# Patient Record
Sex: Male | Born: 1935 | Race: White | Hispanic: No | Marital: Married | Smoking: Former smoker
Health system: Southern US, Community
[De-identification: ages and names within clinical notes are randomized; demographics above are authoritative.]

## PROBLEM LIST (undated history)

## (undated) DIAGNOSIS — N189 Chronic kidney disease, unspecified: Secondary | ICD-10-CM

## (undated) DIAGNOSIS — I10 Essential (primary) hypertension: Secondary | ICD-10-CM

## (undated) HISTORY — PX: APPENDECTOMY: SHX54

## (undated) HISTORY — PX: TONSILLECTOMY AND ADENOIDECTOMY: SUR1326

---

## 2005-02-23 ENCOUNTER — Inpatient Hospital Stay: Payer: Self-pay | Admitting: Internal Medicine

## 2005-02-23 ENCOUNTER — Other Ambulatory Visit: Payer: Self-pay

## 2005-05-11 ENCOUNTER — Ambulatory Visit: Payer: Self-pay | Admitting: Urology

## 2010-12-21 ENCOUNTER — Ambulatory Visit: Payer: Self-pay | Admitting: Nephrology

## 2014-04-26 ENCOUNTER — Emergency Department: Payer: Self-pay | Admitting: Emergency Medicine

## 2014-08-21 DIAGNOSIS — E559 Vitamin D deficiency, unspecified: Secondary | ICD-10-CM | POA: Insufficient documentation

## 2014-08-21 DIAGNOSIS — N320 Bladder-neck obstruction: Secondary | ICD-10-CM | POA: Insufficient documentation

## 2014-08-21 DIAGNOSIS — I10 Essential (primary) hypertension: Secondary | ICD-10-CM | POA: Insufficient documentation

## 2014-08-21 DIAGNOSIS — N289 Disorder of kidney and ureter, unspecified: Secondary | ICD-10-CM | POA: Insufficient documentation

## 2016-05-14 ENCOUNTER — Encounter: Payer: Self-pay | Admitting: Sports Medicine

## 2016-05-14 ENCOUNTER — Ambulatory Visit (INDEPENDENT_AMBULATORY_CARE_PROVIDER_SITE_OTHER): Payer: Medicare Other | Admitting: Sports Medicine

## 2016-05-14 DIAGNOSIS — B351 Tinea unguium: Secondary | ICD-10-CM | POA: Diagnosis not present

## 2016-05-14 DIAGNOSIS — M79674 Pain in right toe(s): Secondary | ICD-10-CM | POA: Diagnosis not present

## 2016-05-14 DIAGNOSIS — M79675 Pain in left toe(s): Secondary | ICD-10-CM | POA: Diagnosis not present

## 2016-05-14 DIAGNOSIS — M2040 Other hammer toe(s) (acquired), unspecified foot: Secondary | ICD-10-CM | POA: Diagnosis not present

## 2016-05-14 DIAGNOSIS — L84 Corns and callosities: Secondary | ICD-10-CM | POA: Diagnosis not present

## 2016-05-14 NOTE — Progress Notes (Signed)
Subjective: Saaketh Goodlet is a 82 y.o. male patient seen today in office with complaint of painful callus and thickened and elongated toenails; unable to trim. Patient denies history of Diabetes, Neuropathy, or Vascular disease. Patient has no other pedal complaints at this time.   Patient Active Problem List   Diagnosis Date Noted  . 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 Hypertension 08/21/2014  . Renal insufficiency 08/21/2014  . Vitamin D deficiency 08/21/2014    No current outpatient prescriptions on file prior to visit.   No current facility-administered medications on file prior to visit.     No Known Allergies  Objective: Physical Exam  General: Well developed, nourished, no acute distress, awake, alert and oriented x 3  Vascular: Dorsalis pedis artery 1/4 bilateral, Posterior tibial artery 1/4 bilateral, skin temperature warm to warm proximal to distal bilateral lower extremities, mild varicosities, pedal hair present bilateral.  Neurological: Gross sensation present via light touch bilateral.   Dermatological: Skin is warm, dry, and supple bilateral, Nails 1-10 are tender, long, thick, and discolored with mild subungal debris, no webspace macerations present bilateral, no open lesions present bilateral, + hyperkeratotic tissue present left 3rd toe distal tuft No signs of infection bilateral.  Musculoskeletal:Hammertoe boney deformities noted bilateral. Muscular strength within normal limits without painon range of motion. No pain with calf compression bilateral.  Assessment and Plan:  Problem List Items Addressed This Visit    None    Visit Diagnoses    Dermatophytosis of nail    -  Primary   Toe pain, bilateral       Corns and callosities       Hammertoe, unspecified laterality         -Examined patient.  -Discussed treatment options for painful mycotic nails and callus. -Mechanically debrided callus x 1 using sterile blade and reduced mycotic nails with sterile  nail nipper and dremel nail file without incident. -Recommend good supportive shoes and gave silicone toe cap for hammertoe -Patient to return in 3 months for follow up evaluation or sooner if symptoms worsen.  Asencion Islam, DPM

## 2018-08-28 ENCOUNTER — Emergency Department: Payer: Medicare Other

## 2018-08-28 ENCOUNTER — Inpatient Hospital Stay
Admission: EM | Admit: 2018-08-28 | Discharge: 2018-08-29 | DRG: 641 | Disposition: A | Discharge status: Home / Self Care | Payer: Medicare Other | Attending: Internal Medicine | Admitting: Internal Medicine

## 2018-08-28 ENCOUNTER — Encounter: Payer: Self-pay | Admitting: Emergency Medicine

## 2018-08-28 DIAGNOSIS — Z8249 Family history of ischemic heart disease and other diseases of the circulatory system: Secondary | ICD-10-CM

## 2018-08-28 DIAGNOSIS — N183 Chronic kidney disease, stage 3 (moderate): Secondary | ICD-10-CM | POA: Diagnosis present

## 2018-08-28 DIAGNOSIS — I129 Hypertensive chronic kidney disease with stage 1 through stage 4 chronic kidney disease, or unspecified chronic kidney disease: Secondary | ICD-10-CM | POA: Diagnosis present

## 2018-08-28 DIAGNOSIS — R509 Fever, unspecified: Secondary | ICD-10-CM

## 2018-08-28 DIAGNOSIS — Z79899 Other long term (current) drug therapy: Secondary | ICD-10-CM | POA: Diagnosis not present

## 2018-08-28 DIAGNOSIS — E559 Vitamin D deficiency, unspecified: Secondary | ICD-10-CM | POA: Diagnosis present

## 2018-08-28 DIAGNOSIS — E871 Hypo-osmolality and hyponatremia: Principal | ICD-10-CM

## 2018-08-28 DIAGNOSIS — J8410 Pulmonary fibrosis, unspecified: Secondary | ICD-10-CM | POA: Diagnosis present

## 2018-08-28 DIAGNOSIS — Z7982 Long term (current) use of aspirin: Secondary | ICD-10-CM

## 2018-08-28 DIAGNOSIS — Z23 Encounter for immunization: Secondary | ICD-10-CM | POA: Diagnosis not present

## 2018-08-28 DIAGNOSIS — Z801 Family history of malignant neoplasm of trachea, bronchus and lung: Secondary | ICD-10-CM | POA: Diagnosis not present

## 2018-08-28 DIAGNOSIS — A419 Sepsis, unspecified organism: Secondary | ICD-10-CM

## 2018-08-28 HISTORY — DX: Essential (primary) hypertension: I10

## 2018-08-28 HISTORY — DX: Chronic kidney disease, unspecified: N18.9

## 2018-08-28 LAB — COMPREHENSIVE METABOLIC PANEL
ALBUMIN: 3.9 g/dL (ref 3.5–5.0)
ALT: 14 U/L (ref 0–44)
AST: 34 U/L (ref 15–41)
Alkaline Phosphatase: 44 U/L (ref 38–126)
Anion gap: 12 (ref 5–15)
BILIRUBIN TOTAL: 1 mg/dL (ref 0.3–1.2)
BUN: 21 mg/dL (ref 8–23)
CO2: 21 mmol/L — ABNORMAL LOW (ref 22–32)
Calcium: 8.3 mg/dL — ABNORMAL LOW (ref 8.9–10.3)
Chloride: 93 mmol/L — ABNORMAL LOW (ref 98–111)
Creatinine, Ser: 1.77 mg/dL — ABNORMAL HIGH (ref 0.61–1.24)
GFR calc Af Amer: 38 mL/min — ABNORMAL LOW (ref >60)
GFR, EST NON AFRICAN AMERICAN: 33 mL/min — AB (ref >60)
Glucose, Bld: 101 mg/dL — ABNORMAL HIGH (ref 70–99)
POTASSIUM: 3.8 mmol/L (ref 3.5–5.1)
Sodium: 126 mmol/L — ABNORMAL LOW (ref 135–145)
TOTAL PROTEIN: 7.5 g/dL (ref 6.5–8.1)

## 2018-08-28 LAB — CBC WITH DIFFERENTIAL/PLATELET
ABS IMMATURE GRANULOCYTES: 0.03 10*3/uL (ref 0.00–0.07)
BASOS ABS: 0.1 10*3/uL (ref 0.0–0.1)
BASOS PCT: 1 %
Eosinophils Absolute: 0 10*3/uL (ref 0.0–0.5)
Eosinophils Relative: 0 %
HCT: 43 % (ref 39.0–52.0)
HEMOGLOBIN: 14.6 g/dL (ref 13.0–17.0)
IMMATURE GRANULOCYTES: 0 %
Lymphocytes Relative: 10 %
Lymphs Abs: 0.8 10*3/uL (ref 0.7–4.0)
MCH: 32.3 pg (ref 26.0–34.0)
MCHC: 34 g/dL (ref 30.0–36.0)
MCV: 95.1 fL (ref 80.0–100.0)
Monocytes Absolute: 0.7 10*3/uL (ref 0.1–1.0)
Monocytes Relative: 9 %
NEUTROS ABS: 5.8 10*3/uL (ref 1.7–7.7)
NEUTROS PCT: 80 %
PLATELETS: 172 10*3/uL (ref 150–400)
RBC: 4.52 MIL/uL (ref 4.22–5.81)
RDW: 13.6 % (ref 11.5–15.5)
WBC: 7.4 10*3/uL (ref 4.0–10.5)
nRBC: 0 % (ref 0.0–0.2)

## 2018-08-28 LAB — INFLUENZA PANEL BY PCR (TYPE A & B)
INFLAPCR: NEGATIVE
Influenza B By PCR: NEGATIVE

## 2018-08-28 LAB — URINALYSIS, COMPLETE (UACMP) WITH MICROSCOPIC
BACTERIA UA: NONE SEEN
BILIRUBIN URINE: NEGATIVE
GLUCOSE, UA: 50 mg/dL — AB
Ketones, ur: 5 mg/dL — AB
Leukocytes, UA: NEGATIVE
NITRITE: NEGATIVE
PH: 7 (ref 5.0–8.0)
Protein, ur: 30 mg/dL — AB
SPECIFIC GRAVITY, URINE: 1.011 (ref 1.005–1.030)
Squamous Epithelial / LPF: NONE SEEN (ref 0–5)

## 2018-08-28 LAB — PROTIME-INR
INR: 1.13
PROTHROMBIN TIME: 14.4 s (ref 11.4–15.2)

## 2018-08-28 LAB — LACTIC ACID, PLASMA: Lactic Acid, Venous: 1.6 mmol/L (ref 0.5–1.9)

## 2018-08-28 MED ORDER — VANCOMYCIN HCL IN DEXTROSE 1-5 GM/200ML-% IV SOLN
1000.0000 mg | Freq: Once | INTRAVENOUS | Status: AC
  Administered 2018-08-28: 1000 mg via INTRAVENOUS
  Filled 2018-08-28: qty 200

## 2018-08-28 MED ORDER — IPRATROPIUM-ALBUTEROL 0.5-2.5 (3) MG/3ML IN SOLN
3.0000 mL | RESPIRATORY_TRACT | Status: DC
  Administered 2018-08-29 (×2): 3 mL via RESPIRATORY_TRACT
  Filled 2018-08-28 (×2): qty 3

## 2018-08-28 MED ORDER — SODIUM CHLORIDE 0.9 % IV SOLN
2.0000 g | Freq: Once | INTRAVENOUS | Status: AC
  Administered 2018-08-28: 2 g via INTRAVENOUS
  Filled 2018-08-28: qty 2

## 2018-08-28 MED ORDER — ACETAMINOPHEN 500 MG PO TABS
1000.0000 mg | ORAL_TABLET | Freq: Once | ORAL | Status: AC
  Administered 2018-08-28: 1000 mg via ORAL

## 2018-08-28 MED ORDER — VANCOMYCIN HCL IN DEXTROSE 1-5 GM/200ML-% IV SOLN
1000.0000 mg | INTRAVENOUS | Status: DC
  Administered 2018-08-29: 1000 mg via INTRAVENOUS
  Filled 2018-08-28: qty 200

## 2018-08-28 MED ORDER — METRONIDAZOLE IN NACL 5-0.79 MG/ML-% IV SOLN
500.0000 mg | Freq: Three times a day (TID) | INTRAVENOUS | Status: DC
  Administered 2018-08-28 – 2018-08-29 (×3): 500 mg via INTRAVENOUS
  Filled 2018-08-28 (×5): qty 100

## 2018-08-28 MED ORDER — ACETAMINOPHEN 500 MG PO TABS
ORAL_TABLET | ORAL | Status: AC
  Filled 2018-08-28: qty 1

## 2018-08-28 MED ORDER — SODIUM CHLORIDE 0.9 % IV SOLN
2.0000 g | INTRAVENOUS | Status: DC
  Filled 2018-08-28: qty 2

## 2018-08-28 MED ORDER — SODIUM CHLORIDE 0.9 % IV SOLN
Freq: Once | INTRAVENOUS | Status: AC
  Administered 2018-08-28: 17:00:00 via INTRAVENOUS

## 2018-08-28 NOTE — Progress Notes (Signed)
Family Meeting Note  Advance Directive:no  Today a meeting took place with the Patient and daughter.   The following clinical team members were present during this meeting:MD  The following were discussed:Patient's diagnosis: Sepsis, hyponatremia, generalized weakness, chronic kidney disease stage III, patient's progosis: Unable to determine and Goals for treatment: Full Code  Discussed with patient and his daughter in the room, they does not have living will papers done yet.  I encouraged them to get it done while he is in the hospital.  Also encouraged to have a discussion about end-of-life care regarding CPR, defibrillator use, intubation and ventilatory use.  Currently patient and his daughter had never thought about it so they would like to just keep full code.  I had encouraged them to think about it and make decisions.  Additional follow-up to be provided: PMD  Time spent during discussion:20 minutes  Altamese Dilling, MD

## 2018-08-28 NOTE — ED Notes (Signed)
To CT Scan

## 2018-08-28 NOTE — ED Triage Notes (Signed)
Patient presents to the ED for chest pain and shortness of breath after taking a generic form of zyrtec.  Patient states, "I felt like I was getting hay fever."  Patient had a sore throat on Friday.

## 2018-08-28 NOTE — Progress Notes (Addendum)
Pharmacy Antibiotic Note  Lucas Wiggins is a 84 y.o. male admitted on 08/28/2018 with sepsis.  Pharmacy has been consulted for cefepime, and vancomycin dosing. Patient is tachycardic and has a RR > 22.   Ke: 0.032 hr-1, t1/2 21.66, Vd 54   Plan: Patient received a 1000 mg x 1 in the ED. Will start a maintenance dose of 1000 mg q24H with 8 hour stacked dosing. Predictive trough of ~15. Plan to order vancomycin trough prior to 4th dose.   Will start cefepime 2 g q24H.   Height: 6\' 1"  (185.4 cm) Weight: 170 lb (77.1 kg) IBW/kg (Calculated) : 79.9  Temp (24hrs), Avg:99.9 F (37.7 C), Min:99.9 F (37.7 C), Max:99.9 F (37.7 C)  Recent Labs  Lab 08/28/18 1346  WBC 7.4  CREATININE 1.77*  LATICACIDVEN 1.6    Estimated Creatinine Clearance: 32.7 mL/min (A) (by C-G formula based on SCr of 1.77 mg/dL (H)).    No Known Allergies  Antimicrobials this admission: 11/11 vancomyicn >>  11/11 cefepime >>    Dose adjustments this admission: Cefepime 2 g q24H due to CrCl 30-50.    Microbiology results: 11/11 BCx 2/2: pending  Thank you for allowing pharmacy to be a part of this patient's care.  Ronnald Ramp, PharmD  Clinical Pharmacist 08/28/2018 8:06 PM

## 2018-08-28 NOTE — Progress Notes (Signed)
CODE SEPSIS - PHARMACY COMMUNICATION  **Broad Spectrum Antibiotics should be administered within 1 hour of Sepsis diagnosis**  Time Code Sepsis Called/Page Received: 11/11 17:23  Antibiotics Ordered: Cefepime 2g x 1, metronidazole 500 mg q8H, and vancomycin 1000 mg x 1  Time of 1st antibiotic administration: 11/11 1803  Additional action taken by pharmacy: Called RN to notify there was 20 min left to administer abx to meet the 1 hour time limit.   If necessary, Name of Provider/Nurse Contacted: Elmarie Mainland ,PharmD Clinical Pharmacist  08/28/2018  6:20 PM

## 2018-08-28 NOTE — ED Notes (Signed)
Have called dietary at [phone] and [phone] multiple times.  No answer.  Dietary tray for pt not delivered.  No trays in refrigerator.  Asked charge nurse who to call; she said she doesn't know.

## 2018-08-28 NOTE — ED Notes (Signed)
CODEWORD for telephone communication with daughter/family:  [facility]

## 2018-08-28 NOTE — ED Notes (Signed)
Family brought pt food to eat from outside hospital.

## 2018-08-28 NOTE — H&P (Signed)
[facility] [place] [facility]   PATIENT NAME: Lucas Wiggins    MR#:  [id number]  DATE OF BIRTH:  27-Dec-1929  DATE OF ADMISSION:  08/28/2018  PRIMARY CARE PHYSICIAN: Gracelyn Nurse, MD   REQUESTING/REFERRING PHYSICIAN: Paduchowski  CHIEF COMPLAINT:   Chief Complaint  Patient presents with  . Shortness of Breath  . Chest Pain    HISTORY OF PRESENT ILLNESS: Lucas Wiggins  is a 84 y.o. male with a known history of chronic kidney disease, hypertension-very active at home, does not use any support to walk, still drives and lives alone.  Goes for fishing trips with his daughter. Last 2 to 3 days had some generalized weakness which is progressively getting worse.  He also have cough.  Started having low-grade fever and shortness of breath and this prompted to come to emergency room today. Noted to be septic, influenza, UA, chest x-ray work-ups are negative.  Started on IV fluid and broad-spectrum antibiotics and given to hospitalist team for further management. He was also noted to have low sodium level.  PAST MEDICAL HISTORY:   Past Medical History:  Diagnosis Date  . Chronic kidney disease   . Hypertension     PAST SURGICAL HISTORY: History reviewed. No pertinent surgical history.  SOCIAL HISTORY:  Social History   Tobacco Use  . Smoking status: Former Games developer  . Smokeless tobacco: Never Used  Substance Use Topics  . Alcohol use: Not on file    FAMILY HISTORY:  Family History  Problem Relation Age of Onset  . Lung cancer Brother   . CAD Father     DRUG ALLERGIES: No Known Allergies  REVIEW OF SYSTEMS:   CONSTITUTIONAL: He had fever, fatigue or weakness.  EYES: No blurred or double vision.  EARS, NOSE, AND THROAT: No tinnitus or ear pain.  RESPIRATORY: Have cough, shortness of breath, no wheezing or hemoptysis.  CARDIOVASCULAR: No chest pain, orthopnea, edema.  GASTROINTESTINAL: No nausea, vomiting, diarrhea or abdominal pain.  GENITOURINARY:  No dysuria, hematuria.  ENDOCRINE: No polyuria, nocturia,  HEMATOLOGY: No anemia, easy bruising or bleeding SKIN: No rash or lesion. MUSCULOSKELETAL: No joint pain or arthritis.   NEUROLOGIC: No tingling, numbness, weakness.  PSYCHIATRY: No anxiety or depression.   MEDICATIONS AT HOME:  Prior to Admission medications   Medication Sig Start Date End Date Taking? Authorizing Provider  aspirin 325 MG tablet Take 325 mg by mouth daily.   Yes [provider]  Iodine, Kelp, (KELP PO) Take 1 tablet by mouth daily.   Yes [provider]  multivitamin (ONE-A-DAY MEN'S) TABS tablet Take 1 tablet by mouth daily.   Yes [provider]      PHYSICAL EXAMINATION:   VITAL SIGNS: Blood pressure (!) 122/98, pulse (!) 125, temperature 99.9 F (37.7 C), temperature source Oral, resp. rate (!) 22, height 6\' 1"  (1.854 m), weight 77.1 kg, SpO2 93 %.  GENERAL:  84 y.o.-year-old patient lying in the bed with no acute distress.  EYES: Pupils equal, round, reactive to light and accommodation. No scleral icterus. Extraocular muscles intact.  HEENT: Head atraumatic, normocephalic. Oropharynx and nasopharynx clear.  NECK:  Supple, no jugular venous distention. No thyroid enlargement, no tenderness.  LUNGS: Normal breath sounds bilaterally, no wheezing, some crepitation. No use of accessory muscles of respiration.  CARDIOVASCULAR: S1, S2 normal. No murmurs, rubs, or gallops.  ABDOMEN: Soft, nontender, nondistended. Bowel sounds present. No organomegaly or mass.  EXTREMITIES: No pedal edema, cyanosis, or clubbing.  NEUROLOGIC:  Cranial nerves II through XII are intact. Muscle strength 4/5 in all extremities. Sensation intact. Gait not checked.  PSYCHIATRIC: The patient is alert and oriented x 3.  SKIN: No obvious rash, lesion, or ulcer.   LABORATORY PANEL:   CBC Recent Labs  Lab 08/28/18 1346  WBC 7.4  HGB 14.6  HCT 43.0  PLT 172  MCV 95.1  MCH 32.3  MCHC 34.0  RDW 13.6   LYMPHSABS 0.8  MONOABS 0.7  EOSABS 0.0  BASOSABS 0.1   ------------------------------------------------------------------------------------------------------------------  Chemistries  Recent Labs  Lab 08/28/18 1346  NA 126*  K 3.8  CL 93*  CO2 21*  GLUCOSE 101*  BUN 21  CREATININE 1.77*  CALCIUM 8.3*  AST 34  ALT 14  ALKPHOS 44  BILITOT 1.0   ------------------------------------------------------------------------------------------------------------------ estimated creatinine clearance is 32.7 mL/min (A) (by C-G formula based on SCr of 1.77 mg/dL (H)). ------------------------------------------------------------------------------------------------------------------ No results for input(s): TSH, T4TOTAL, T3FREE, THYROIDAB in the last 72 hours.  Invalid input(s): FREET3   Coagulation profile Recent Labs  Lab 08/28/18 1346  INR 1.13   ------------------------------------------------------------------------------------------------------------------- No results for input(s): DDIMER in the last 72 hours. -------------------------------------------------------------------------------------------------------------------  Cardiac Enzymes No results for input(s): CKMB, TROPONINI, MYOGLOBIN in the last 168 hours.  Invalid input(s): CK ------------------------------------------------------------------------------------------------------------------ Invalid input(s): POCBNP  ---------------------------------------------------------------------------------------------------------------  Urinalysis    Component Value Date/Time   COLORURINE YELLOW (A) 08/28/2018 1626   APPEARANCEUR CLEAR (A) 08/28/2018 1626   LABSPEC 1.011 08/28/2018 1626   PHURINE 7.0 08/28/2018 1626   GLUCOSEU 50 (A) 08/28/2018 1626   HGBUR SMALL (A) 08/28/2018 1626   BILIRUBINUR NEGATIVE 08/28/2018 1626   KETONESUR 5 (A) 08/28/2018 1626   PROTEINUR 30 (A) 08/28/2018 1626   NITRITE NEGATIVE  08/28/2018 1626   LEUKOCYTESUR NEGATIVE 08/28/2018 1626     RADIOLOGY: Dg Chest 2 View  Result Date: 08/28/2018 CLINICAL DATA:  Chest pain and shortness of breath EXAM: CHEST - 2 VIEW COMPARISON:  Feb 23, 2005 FINDINGS: There is widespread interstitial fibrosis throughout the lungs bilaterally, slightly more on the left than on the right. There is atelectatic change in the left lower lobe. There is no frank consolidation. Heart size and pulmonary vascularity normal. No adenopathy. There is wedging of midthoracic vertebral bodies with increase in kyphosis in the midthoracic region. IMPRESSION: Widespread interstitial fibrosis bilaterally without consolidation. No adenopathy. Heart size normal. Electronically Signed   By: Bretta Bang III M.D.   On: 08/28/2018 14:20   Ct Chest Wo Contrast  Result Date: 08/28/2018 CLINICAL DATA:  84 year old male with chest pain and shortness of breath after taking generic form of Zyrtec. Subsequent encounter. EXAM: CT CHEST WITHOUT CONTRAST TECHNIQUE: Multidetector CT imaging of the chest was performed following the standard protocol without IV contrast. COMPARISON:  08/28/2018 and 02/23/2005 chest x-ray. 12/21/2010 CT abdomen and pelvis. FINDINGS: Cardiovascular: Heart size within normal limits. Trace pericardial fluid. Marked coronary artery calcifications. Atherosclerotic changes thoracic aorta. Ascending thoracic aorta measures 3.7 cm. Mediastinum/Nodes: Increase number of normal slightly enlarged lymph nodes. Largest lymph node subcarinal region with short axis dimension of 1.9 cm. Lungs/Pleura: Diffuse subpleural cystic changes greater in the bases. Associated reticulation. Findings suggestive of UIP. 7 mm nodule posteromedial right lower lobe (series 4, image 63 and series 10, image 75). Trachea and mainstem bronchi are patent. Upper Abdomen: Renal cysts. Musculoskeletal: Remote T7 and T8 anterior wedge compression fracture with 70% loss of height anterior  aspect T7 and 80% loss of height anterior aspect T8. Vacuum discs T7-8 and T8-9.  Mild kyphosis centered at this level. Schmorl's node deformities superior endplate T7, T8, T9 and inferior endplate T7 and T10. IMPRESSION: 1. Diffuse subpleural cystic changes greater in the bases. Associated reticulation. Findings suggestive of UIP. 2. 7 mm nodule posteromedial right lower lobe (series 4, image 63 and series 10, image 75). Non-contrast chest CT at 6-12 months is recommended. If the nodule is stable at time of repeat CT, then future CT at 18-24 months (from today's scan) is considered optional for low-risk patients, but is recommended for high-risk patients. This recommendation follows the consensus statement: Guidelines for Management of Incidental Pulmonary Nodules Detected on CT Images: From the Fleischner Society 2017; Radiology 2017; 284:228-243. 3. Increase number of normal to slightly enlarged lymph nodes. Largest lymph node subcarinal region with short axis dimension of 1.9 cm. 4. Prominent coronary artery calcifications. 5.  Aortic Atherosclerosis (ICD10-I70.0). 6. Remote T7 and T8 anterior wedge compression fracture with 70% loss of height anterior aspect T7 and 80% loss of height anterior aspect T8. Mild kyphosis centered at this level. Electronically Signed   By: Lacy Duverney M.D.   On: 08/28/2018 18:05    EKG: Orders placed or performed during the hospital encounter of 08/28/18  . EKG 12-Lead  . EKG 12-Lead  . ED EKG 12-Lead  . ED EKG 12-Lead    IMPRESSION AND PLAN:  *Sepsis Unknown origin for now. IV vancomycin and cefepime. IV fluids. Follow culture results. So far influenza, urinalysis, chest x-rays are negative.  *Hyponatremia Could be due to sepsis and decreased intake. IV fluids and recheck  *Chronic kidney disease stage III Appears at baseline, continue to monitor.   All the records are reviewed and case discussed with ED provider. Management plans discussed with the  patient, family and they are in agreement.  CODE STATUS: Full code.  Daughter was present in the room during my visit.  TOTAL TIME TAKING CARE OF THIS PATIENT: 45 minutes.    Altamese Dilling M.D on 08/28/2018   Between 7am to 6pm - Pager - [phone]  After 6pm go to [email or web address] - password [email or web address]  [facility]  Office  [phone]  CC: Primary care physician; Gracelyn Nurse, MD   Note: This dictation was prepared with Dragon dictation along with smaller phrase technology. Any transcriptional errors that result from this process are unintentional.

## 2018-08-28 NOTE — ED Provider Notes (Signed)
[facility] Emergency Department Provider Note  Time seen: 5:21 PM  I have reviewed the triage vital signs and the nursing notes.   HISTORY  Chief Complaint Shortness of Breath and Chest Pain    HPI Junius Faucett is a 84 y.o. male with a past medical history of hypertension, presents to the emergency department for weakness, shortness of breath, sore throat.  According to the patient since Friday he has been feeling a slight sore throat, some generalized fatigue weakness low-grade fever and shortness of breath.  States they measured a temperature of 99.5 at home.  States a slight cough and dry throat.  Mild shortness of breath worse with exertion.  General fatigue and weakness over the past 2 to 3 days.  No vomiting or diarrhea.  No dysuria.  No leg pain or swelling.  Largely negative review of systems otherwise.   History reviewed. No pertinent past medical history.  Patient Active Problem List   Diagnosis Date Noted  . 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 Hypertension 08/21/2014  . Renal insufficiency 08/21/2014  . Vitamin D deficiency 08/21/2014    History reviewed. No pertinent surgical history.  Prior to Admission medications   Medication Sig Start Date End Date Taking? Authorizing Provider  aspirin EC 81 MG tablet Take by mouth.    [provider]  Cholecalciferol 4000 units CAPS Take by mouth.    [provider]  Iodine, Kelp, 0.15 MG TABS Take by mouth.    [provider]  Saw Palmetto Extract (GNP SAW PALMETTO EXTRACT) 80-15 MG CAPS Take by mouth.    [provider]    No Known Allergies  No family history on file.  Social History Social History   Tobacco Use  . Smoking status: Unknown If Ever Smoked  . Smokeless tobacco: Never Used  Substance Use Topics  . Alcohol use: Not on file  . Drug use: Not on file    Review of Systems Constitutional: Low-grade fever at home.  Positive for generalized  weakness. ENT: Mild dry throat. Cardiovascular: Negative for chest pain. Respiratory: Mild shortness of breath, occasional cough. Gastrointestinal: Negative for abdominal pain, vomiting and diarrhea. Genitourinary: Negative for urinary compaints Musculoskeletal: Negative for musculoskeletal complaints Skin: Negative for skin complaints  Neurological: Negative for headache All other ROS negative  ____________________________________________   PHYSICAL EXAM:  VITAL SIGNS: ED Triage Vitals  Enc Vitals Group     BP 08/28/18 1313 (!) 134/98     Pulse Rate 08/28/18 1313 100     Resp 08/28/18 1313 (!) 24     Temp 08/28/18 1313 99.9 F (37.7 C)     Temp Source 08/28/18 1313 Oral     SpO2 08/28/18 1313 95 %     Weight 08/28/18 1322 170 lb (77.1 kg)     Height 08/28/18 1322 6\' 1"  (1.854 m)     Head Circumference --      Peak Flow --      Pain Score 08/28/18 1317 2     Pain Loc --      Pain Edu? --      Excl. in GC? --    Constitutional: Alert and oriented.  Hard of hearing, but no acute distress. Eyes: Normal exam ENT   Head: Normocephalic and atraumatic.   Mouth/Throat: Mucous membranes are moist. Cardiovascular: Normal rate, regular rhythm. No murmur Respiratory: Normal respiratory effort without tachypnea nor retractions. Breath sounds are clear, occasional cough.  Currently satting 91% on room  air. Gastrointestinal: Soft and nontender. No distention.  Musculoskeletal: Nontender with normal range of motion in all extremities. No lower extremity tenderness or edema. Neurologic:  Normal speech and language. No gross focal neurologic deficits  Skin:  Skin is warm, dry and intact.  Psychiatric: Mood and affect are normal.   ____________________________________________    EKG  EKG reviewed and interpreted by myself shows atrial fibrillation 89 bpm with a narrow QRS, normal axis, normal intervals, nonspecific ST changes.  ____________________________________________     RADIOLOGY  Chest x-ray shows widespread interstitial fibrosis without consolidation.  ____________________________________________   INITIAL IMPRESSION / ASSESSMENT AND PLAN / ED COURSE  Pertinent labs & imaging results that were available during my care of the patient were reviewed by me and considered in my medical decision making (see chart for details).  Patient presents to the emergency department for shortness of breath mild cough, generalized fatigue weakness found to have a low-grade fever 99.9 in the emergency department.  Patient is tachycardic to 125 with a respiratory rate of 22 satting between 90 and 93% on room air.  Differential would include sepsis, pneumonia, viral infection, upper restaurant infection, influenza, electrolyte or metabolic abnormality.  Patient's labs have resulted showing a lactate of 1.6 and a normal white blood cell count.  However the patient's left sodium is decreased to 126 which would explain most of the patient's weakness.  Given his elevated heart rate of 125 upon arrival with temperature of 99.9 respiratory rate of 22 we will cover for sepsis criteria with broad-spectrum antibiotics.  Chest x-ray shows interstitial fibrosis however the patient denies any history of fibrosis, will obtain a CT scan to help rule out atypical pneumonia.  Patient agreeable plan of care.  Patient will require admission to the hospital once imaging is resulted.  CT scan of the chest is negative for acute infection.  Influenza is negative.  Urinalysis is normal.  Given the patient's hyponatremia at 126 tachypnea with tachycardia and borderline temperature of 99.9 we will admit to the hospital service for continued antibiotics until blood cultures grow out as well as IV hydration to replete sodium.  CRITICAL CARE Performed by: Minna Antis   Total critical care time: 30 minutes  Critical care time was exclusive of separately billable procedures and treating other  patients.  Critical care was necessary to treat or prevent imminent or life-threatening deterioration.  Critical care was time spent personally by me on the following activities: development of treatment plan with patient and/or surrogate as well as nursing, discussions with consultants, evaluation of patient's response to treatment, examination of patient, obtaining history from patient or surrogate, ordering and performing treatments and interventions, ordering and review of laboratory studies, ordering and review of radiographic studies, pulse oximetry and re-evaluation of patient's condition.   ____________________________________________   FINAL CLINICAL IMPRESSION(S) / ED DIAGNOSES  Sepsis Upper respiratory infection Hyponatremia   Minna Antis, MD 08/28/18 1925

## 2018-08-29 ENCOUNTER — Other Ambulatory Visit: Payer: Self-pay

## 2018-08-29 LAB — CBC
HEMATOCRIT: 38.3 % — AB (ref 39.0–52.0)
HEMOGLOBIN: 13 g/dL (ref 13.0–17.0)
MCH: 31.8 pg (ref 26.0–34.0)
MCHC: 33.9 g/dL (ref 30.0–36.0)
MCV: 93.6 fL (ref 80.0–100.0)
NRBC: 0 % (ref 0.0–0.2)
Platelets: 161 10*3/uL (ref 150–400)
RBC: 4.09 MIL/uL — ABNORMAL LOW (ref 4.22–5.81)
RDW: 13.7 % (ref 11.5–15.5)
WBC: 6 10*3/uL (ref 4.0–10.5)

## 2018-08-29 LAB — BASIC METABOLIC PANEL
Anion gap: 8 (ref 5–15)
BUN: 21 mg/dL (ref 8–23)
CALCIUM: 8.3 mg/dL — AB (ref 8.9–10.3)
CO2: 21 mmol/L — AB (ref 22–32)
Chloride: 99 mmol/L (ref 98–111)
Creatinine, Ser: 1.88 mg/dL — ABNORMAL HIGH (ref 0.61–1.24)
GFR calc non Af Amer: 31 mL/min — ABNORMAL LOW (ref >60)
GFR, EST AFRICAN AMERICAN: 36 mL/min — AB (ref >60)
GLUCOSE: 113 mg/dL — AB (ref 70–99)
POTASSIUM: 3.6 mmol/L (ref 3.5–5.1)
Sodium: 128 mmol/L — ABNORMAL LOW (ref 135–145)

## 2018-08-29 LAB — LACTIC ACID, PLASMA: Lactic Acid, Venous: 1.1 mmol/L (ref 0.5–1.9)

## 2018-08-29 MED ORDER — DOCUSATE SODIUM 100 MG PO CAPS: 100.0000 mg | ORAL_CAPSULE | Freq: Two times a day (BID) | ORAL | Status: DC | PRN

## 2018-08-29 MED ORDER — ASPIRIN 325 MG PO TABS
325.0000 mg | ORAL_TABLET | Freq: Every day | ORAL | Status: DC
  Administered 2018-08-29: 325 mg via ORAL
  Filled 2018-08-29: qty 1

## 2018-08-29 MED ORDER — HEPARIN SODIUM (PORCINE) 5000 UNIT/ML IJ SOLN
5000.0000 [IU] | Freq: Three times a day (TID) | INTRAMUSCULAR | Status: DC
  Administered 2018-08-29: 5000 [IU] via SUBCUTANEOUS
  Filled 2018-08-29: qty 1

## 2018-08-29 MED ORDER — PNEUMOCOCCAL VAC POLYVALENT 25 MCG/0.5ML IJ INJ
0.5000 mL | INJECTION | INTRAMUSCULAR | Status: AC | PRN
  Administered 2018-08-29: 0.5 mL via INTRAMUSCULAR
  Filled 2018-08-29: qty 0.5

## 2018-08-29 MED ORDER — IPRATROPIUM-ALBUTEROL 0.5-2.5 (3) MG/3ML IN SOLN
3.0000 mL | Freq: Four times a day (QID) | RESPIRATORY_TRACT | Status: DC
  Filled 2018-08-29: qty 3

## 2018-08-29 MED ORDER — PNEUMOCOCCAL VAC POLYVALENT 25 MCG/0.5ML IJ INJ: 0.5000 mL | INJECTION | INTRAMUSCULAR | Status: DC

## 2018-08-29 NOTE — Progress Notes (Signed)
Discharge instructions explained to pt and pts daughter/ verbalized an understanding/ iv and tele removed/ will transport off unit via wheelchair 

## 2018-08-29 NOTE — Discharge Summary (Signed)
[facility] - [place] at [facility]   PATIENT NAME: Lucas Wiggins    MR#:  [id number]  DATE OF BIRTH:  01-28-1930  DATE OF ADMISSION:  08/28/2018 ADMITTING PHYSICIAN: Altamese Dilling, MD  DATE OF DISCHARGE: 11.12.2019  PRIMARY CARE PHYSICIAN: Gracelyn Nurse, MD    ADMISSION DIAGNOSIS:  Hyponatremia [E87.1] Fever, unspecified fever cause [R50.9] Sepsis, due to unspecified organism, unspecified whether acute organ dysfunction present (HCC) [A41.9]  DISCHARGE DIAGNOSIS:  *generalized weakness due to hyponatremia improving *pulmonary fibrosis /interstitial infiltrates noted on CT scan of the lungs--- all up outpatient with PCP/pulmonary MD SECONDARY DIAGNOSIS:   Past Medical History:  Diagnosis Date  . Chronic kidney disease   . Hypertension     HOSPITAL COURSE:  Lucas Wiggins  is a 84 y.o. male with a known history of chronic kidney disease, hypertension-very active at home, does not use any support to walk, still drives and lives alone.  Goes for fishing trips with his daughter. Last 2 to 3 days had some generalized weakness which is progressively getting worse.  He also have cough.  *Sepsis on admission ruled out DC IV antibiotics  so far influenza, urinalysis, chest x-rays are negative. No fever, no tachycardia, lactic acid normal, normal white count blood cultures negative patient is symptomatic  *acute hyponatremia -was recently advised by PCP to have low-salt diet. He is following recommendations how were his sodium came in with 126 received IV fluids went up to 128 -overall feels better. He is urging/requesting for me to let him go home. Daughter in the room who say she patient is missing his cats and wants to be home. -It's with patient and daughter I'm okay with patient discharging as long as he takes appropriate intake and follows up with Dr. Letitia Libra and get metabolic panel checked on November 14 to ensure sodium improving patient's  baseline sodium on October 30 was 137  *Chronic kidney disease stage III -baseline creatinine is 1.8. Appears at baseline, continue to monitor.  *Abnormal chest x-ray and abnormal CT chest -showing pulmonary fibrosis/interstitial infiltrates -I showed the CT and x-ray to the daughter. Patient is a non-smoker. -Discussed with daughter to follow up with Dr. Letitia Libra and thereby follow-up with Dr. Meredeth Ide as outpatient -patient is having some shortness of breath and mild cough which is nonproductive. -I'm holding off any antibiotics or steroids at present -daughter voiced understanding  Will discharge patient to home upon his request and follow-up with PCP CONSULTS OBTAINED:    DRUG ALLERGIES:  No Known Allergies  DISCHARGE MEDICATIONS:   Allergies as of 08/29/2018   No Known Allergies     Medication List    TAKE these medications   aspirin 325 MG tablet Take 325 mg by mouth daily.   KELP PO Take 1 tablet by mouth daily.   multivitamin Tabs tablet Take 1 tablet by mouth daily.       If you experience worsening of your admission symptoms, develop shortness of breath, life threatening emergency, suicidal or homicidal thoughts you must seek medical attention immediately by calling 911 or calling your MD immediately  if symptoms less severe.  You Must read complete instructions/literature along with all the possible adverse reactions/side effects for all the Medicines you take and that have been prescribed to you. Take any new Medicines after you have completely understood and accept all the possible adverse reactions/side effects.   Please note  You were cared for by a hospitalist during your hospital stay. If you  have any questions about your discharge medications or the care you received while you were in the hospital after you are discharged, you can call the unit and asked to speak with the hospitalist on call if the hospitalist that took care of you is not available.  Once you are discharged, your primary care physician will handle any further medical issues. Please note that NO REFILLS for any discharge medications will be authorized once you are discharged, as it is imperative that you return to your primary care physician (or establish a relationship with a primary care physician if you do not have one) for your aftercare needs so that they can reassess your need for medications and monitor your lab values. Today   SUBJECTIVE   Feels better wants to go home  VITAL SIGNS:  Blood pressure 130/65, pulse 80, temperature 99.4 F (37.4 C), temperature source Oral, resp. rate 19, height 6\' 2"  (1.88 m), weight 78.1 kg, SpO2 97 %.  I/O:    Intake/Output Summary (Last 24 hours) at 08/29/2018 1412 Last data filed at 08/29/2018 1256 Gross per 24 hour  Intake 1345.99 ml  Output 1650 ml  Net -304.01 ml    PHYSICAL EXAMINATION:  GENERAL:  84 y.o.-year-old patient lying in the bed with no acute distress.  EYES: Pupils equal, round, reactive to light and accommodation. No scleral icterus. Extraocular muscles intact.  HEENT: Head atraumatic, normocephalic. Oropharynx and nasopharynx clear.  NECK:  Supple, no jugular venous distention. No thyroid enlargement, no tenderness.  LUNGS: Normal breath sounds bilaterally, no wheezing, rales,rhonchi or crepitation. No use of accessory muscles of respiration.  CARDIOVASCULAR: S1, S2 normal. No murmurs, rubs, or gallops.  ABDOMEN: Soft, non-tender, non-distended. Bowel sounds present. No organomegaly or mass.  EXTREMITIES: No pedal edema, cyanosis, or clubbing.  NEUROLOGIC: Cranial nerves II through XII are intact. Muscle strength 5/5 in all extremities. Sensation intact. Gait not checked.  PSYCHIATRIC: The patient is alert and oriented x 3.  SKIN: No obvious rash, lesion, or ulcer.   DATA REVIEW:   CBC  Recent Labs  Lab 08/29/18 0047  WBC 6.0  HGB 13.0  HCT 38.3*  PLT 161    Chemistries  Recent Labs  Lab  08/28/18 1346 08/29/18 0047  NA 126* 128*  K 3.8 3.6  CL 93* 99  CO2 21* 21*  GLUCOSE 101* 113*  BUN 21 21  CREATININE 1.77* 1.88*  CALCIUM 8.3* 8.3*  AST 34  --   ALT 14  --   ALKPHOS 44  --   BILITOT 1.0  --     Microbiology Results   Recent Results (from the past 240 hour(s))  Culture, blood (Routine x 2)     Status: None (Preliminary result)   Collection Time: 08/28/18  1:46 PM  Result Value Ref Range Status   Specimen Description BLOOD BLOOD LEFT ARM  Final   Special Requests   Final    BOTTLES DRAWN AEROBIC ONLY Blood Culture results may not be optimal due to an inadequate volume of blood received in culture bottles   Culture   Final    NO GROWTH < 24 HOURS Performed at [facility], [address] [place], [place] [address]    Report Status PENDING  Incomplete  Culture, blood (Routine x 2)     Status: None (Preliminary result)   Collection Time: 08/28/18  1:46 PM  Result Value Ref Range Status   Specimen Description BLOOD BLOOD RIGHT HAND  Final   Special Requests  Final    BOTTLES DRAWN AEROBIC ONLY Blood Culture results may not be optimal due to an inadequate volume of blood received in culture bottles   Culture   Final    NO GROWTH < 24 HOURS Performed at [facility], [address] [place], [place] [address]    Report Status PENDING  Incomplete    RADIOLOGY:  Dg Chest 2 View  Result Date: 08/28/2018 CLINICAL DATA:  Chest pain and shortness of breath EXAM: CHEST - 2 VIEW COMPARISON:  Feb 23, 2005 FINDINGS: There is widespread interstitial fibrosis throughout the lungs bilaterally, slightly more on the left than on the right. There is atelectatic change in the left lower lobe. There is no frank consolidation. Heart size and pulmonary vascularity normal. No adenopathy. There is wedging of midthoracic vertebral bodies with increase in kyphosis in the midthoracic region. IMPRESSION: Widespread interstitial fibrosis bilaterally without  consolidation. No adenopathy. Heart size normal. Electronically Signed   By: Bretta Bang III M.D.   On: 08/28/2018 14:20   Ct Chest Wo Contrast  Result Date: 08/28/2018 CLINICAL DATA:  84 year old male with chest pain and shortness of breath after taking generic form of Zyrtec. Subsequent encounter. EXAM: CT CHEST WITHOUT CONTRAST TECHNIQUE: Multidetector CT imaging of the chest was performed following the standard protocol without IV contrast. COMPARISON:  08/28/2018 and 02/23/2005 chest x-ray. 12/21/2010 CT abdomen and pelvis. FINDINGS: Cardiovascular: Heart size within normal limits. Trace pericardial fluid. Marked coronary artery calcifications. Atherosclerotic changes thoracic aorta. Ascending thoracic aorta measures 3.7 cm. Mediastinum/Nodes: Increase number of normal slightly enlarged lymph nodes. Largest lymph node subcarinal region with short axis dimension of 1.9 cm. Lungs/Pleura: Diffuse subpleural cystic changes greater in the bases. Associated reticulation. Findings suggestive of UIP. 7 mm nodule posteromedial right lower lobe (series 4, image 63 and series 10, image 75). Trachea and mainstem bronchi are patent. Upper Abdomen: Renal cysts. Musculoskeletal: Remote T7 and T8 anterior wedge compression fracture with 70% loss of height anterior aspect T7 and 80% loss of height anterior aspect T8. Vacuum discs T7-8 and T8-9. Mild kyphosis centered at this level. Schmorl's node deformities superior endplate T7, T8, T9 and inferior endplate T7 and T10. IMPRESSION: 1. Diffuse subpleural cystic changes greater in the bases. Associated reticulation. Findings suggestive of UIP. 2. 7 mm nodule posteromedial right lower lobe (series 4, image 63 and series 10, image 75). Non-contrast chest CT at 6-12 months is recommended. If the nodule is stable at time of repeat CT, then future CT at 18-24 months (from today's scan) is considered optional for low-risk patients, but is recommended for high-risk patients.  This recommendation follows the consensus statement: Guidelines for Management of Incidental Pulmonary Nodules Detected on CT Images: From the Fleischner Society 2017; Radiology 2017; 284:228-243. 3. Increase number of normal to slightly enlarged lymph nodes. Largest lymph node subcarinal region with short axis dimension of 1.9 cm. 4. Prominent coronary artery calcifications. 5.  Aortic Atherosclerosis (ICD10-I70.0). 6. Remote T7 and T8 anterior wedge compression fracture with 70% loss of height anterior aspect T7 and 80% loss of height anterior aspect T8. Mild kyphosis centered at this level. Electronically Signed   By: Lacy Duverney M.D.   On: 08/28/2018 18:05     Management plans discussed with the patient, family and they are in agreement.  CODE STATUS:     Code Status Orders  (From admission, onward)         Start     Ordered   08/29/18 0102  Full  code  Continuous     08/29/18 0102        Code Status History    This patient has a current code status but no historical code status.      TOTAL TIME TAKING CARE OF THIS PATIENT: *40* minutes.    Enedina Finner M.D on 08/29/2018 at 2:12 PM  Between 7am to 6pm - Pager - [phone] After 6pm go to [email or web address] - password [email or web address]  [facility]  Office  [phone]  CC: Primary care physician; Gracelyn Nurse, MD

## 2018-08-30 LAB — URINE CULTURE: Culture: <10000 — AB

## 2018-09-02 LAB — CULTURE, BLOOD (ROUTINE X 2)
CULTURE: NO GROWTH
CULTURE: NO GROWTH

## 2018-09-04 ENCOUNTER — Other Ambulatory Visit: Payer: Self-pay

## 2018-09-04 ENCOUNTER — Emergency Department: Payer: Medicare Other

## 2018-09-04 ENCOUNTER — Emergency Department
Admission: EM | Admit: 2018-09-04 | Discharge: 2018-09-17 | Disposition: E | Payer: Medicare Other | Attending: Emergency Medicine | Admitting: Emergency Medicine

## 2018-09-04 ENCOUNTER — Encounter: Payer: Self-pay | Admitting: Emergency Medicine

## 2018-09-04 DIAGNOSIS — Z87891 Personal history of nicotine dependence: Secondary | ICD-10-CM | POA: Diagnosis not present

## 2018-09-04 DIAGNOSIS — R0602 Shortness of breath: Secondary | ICD-10-CM | POA: Diagnosis present

## 2018-09-04 DIAGNOSIS — Z7982 Long term (current) use of aspirin: Secondary | ICD-10-CM | POA: Diagnosis not present

## 2018-09-04 DIAGNOSIS — I129 Hypertensive chronic kidney disease with stage 1 through stage 4 chronic kidney disease, or unspecified chronic kidney disease: Secondary | ICD-10-CM | POA: Insufficient documentation

## 2018-09-04 DIAGNOSIS — R6521 Severe sepsis with septic shock: Secondary | ICD-10-CM | POA: Insufficient documentation

## 2018-09-04 DIAGNOSIS — J9601 Acute respiratory failure with hypoxia: Secondary | ICD-10-CM | POA: Insufficient documentation

## 2018-09-04 DIAGNOSIS — I10 Essential (primary) hypertension: Secondary | ICD-10-CM | POA: Diagnosis present

## 2018-09-04 DIAGNOSIS — A419 Sepsis, unspecified organism: Secondary | ICD-10-CM | POA: Diagnosis not present

## 2018-09-04 DIAGNOSIS — N189 Chronic kidney disease, unspecified: Secondary | ICD-10-CM | POA: Diagnosis not present

## 2018-09-04 LAB — BLOOD GAS, ARTERIAL
Acid-base deficit: 13.3 mmol/L — ABNORMAL HIGH (ref 0.0–2.0)
Bicarbonate: 14.6 mmol/L — ABNORMAL LOW (ref 20.0–28.0)
FIO2: 0.5
Mechanical Rate: 20
O2 SAT: 97.7 %
PCO2 ART: 40 mmHg (ref 32.0–48.0)
PEEP/CPAP: 8 cmH2O
PH ART: 7.17 — AB (ref 7.350–7.450)
Patient temperature: 37
VT: 450 mL
pO2, Arterial: 123 mmHg — ABNORMAL HIGH (ref 83.0–108.0)

## 2018-09-04 LAB — URINALYSIS, COMPLETE (UACMP) WITH MICROSCOPIC
Bilirubin Urine: NEGATIVE
Glucose, UA: 50 mg/dL — AB
KETONES UR: NEGATIVE mg/dL
Leukocytes, UA: NEGATIVE
Nitrite: NEGATIVE
Protein, ur: 30 mg/dL — AB
SQUAMOUS EPITHELIAL / LPF: NONE SEEN (ref 0–5)
Specific Gravity, Urine: 1.008 (ref 1.005–1.030)
pH: 6 (ref 5.0–8.0)

## 2018-09-04 LAB — COMPREHENSIVE METABOLIC PANEL
ALBUMIN: 3.1 g/dL — AB (ref 3.5–5.0)
ALT: 34 U/L (ref 0–44)
AST: 46 U/L — AB (ref 15–41)
Alkaline Phosphatase: 51 U/L (ref 38–126)
Anion gap: 12 (ref 5–15)
BILIRUBIN TOTAL: 1.2 mg/dL (ref 0.3–1.2)
BUN: 34 mg/dL — AB (ref 8–23)
CHLORIDE: 94 mmol/L — AB (ref 98–111)
CO2: 22 mmol/L (ref 22–32)
Calcium: 9.1 mg/dL (ref 8.9–10.3)
Creatinine, Ser: 2.41 mg/dL — ABNORMAL HIGH (ref 0.61–1.24)
GFR calc Af Amer: 26 mL/min — ABNORMAL LOW (ref >60)
GFR calc non Af Amer: 23 mL/min — ABNORMAL LOW (ref >60)
GLUCOSE: 121 mg/dL — AB (ref 70–99)
POTASSIUM: 3.6 mmol/L (ref 3.5–5.1)
SODIUM: 128 mmol/L — AB (ref 135–145)
Total Protein: 6.9 g/dL (ref 6.5–8.1)

## 2018-09-04 LAB — TROPONIN I: Troponin I: 0.43 ng/mL (ref <0.03)

## 2018-09-04 LAB — INFLUENZA PANEL BY PCR (TYPE A & B)
INFLAPCR: NEGATIVE
INFLBPCR: NEGATIVE

## 2018-09-04 LAB — CBC WITH DIFFERENTIAL/PLATELET
Abs Immature Granulocytes: 0.18 10*3/uL — ABNORMAL HIGH (ref 0.00–0.07)
BASOS ABS: 0.1 10*3/uL (ref 0.0–0.1)
Basophils Relative: 1 %
Eosinophils Absolute: 0.5 10*3/uL (ref 0.0–0.5)
Eosinophils Relative: 3 %
HEMATOCRIT: 38.7 % — AB (ref 39.0–52.0)
HEMOGLOBIN: 13.3 g/dL (ref 13.0–17.0)
IMMATURE GRANULOCYTES: 1 %
LYMPHS ABS: 1.8 10*3/uL (ref 0.7–4.0)
LYMPHS PCT: 12 %
MCH: 32 pg (ref 26.0–34.0)
MCHC: 34.4 g/dL (ref 30.0–36.0)
MCV: 93 fL (ref 80.0–100.0)
Monocytes Absolute: 1 10*3/uL (ref 0.1–1.0)
Monocytes Relative: 7 %
NEUTROS ABS: 12.1 10*3/uL — AB (ref 1.7–7.7)
NEUTROS PCT: 76 %
Platelets: 249 10*3/uL (ref 150–400)
RBC: 4.16 MIL/uL — AB (ref 4.22–5.81)
RDW: 13.4 % (ref 11.5–15.5)
WBC: 15.7 10*3/uL — ABNORMAL HIGH (ref 4.0–10.5)
nRBC: 0 % (ref 0.0–0.2)

## 2018-09-04 LAB — LACTIC ACID, PLASMA: Lactic Acid, Venous: 2.9 mmol/L (ref 0.5–1.9)

## 2018-09-04 LAB — BRAIN NATRIURETIC PEPTIDE: B Natriuretic Peptide: 1036 pg/mL — ABNORMAL HIGH (ref 0.0–100.0)

## 2018-09-04 MED ORDER — SUCCINYLCHOLINE CHLORIDE 20 MG/ML IJ SOLN
160.0000 mg | Freq: Once | INTRAMUSCULAR | Status: AC
  Administered 2018-09-04: 160 mg via INTRAVENOUS

## 2018-09-04 MED ORDER — PROPOFOL 1000 MG/100ML IV EMUL
INTRAVENOUS | Status: AC
  Administered 2018-09-04: 30 ug/kg/min via INTRAVENOUS
  Filled 2018-09-04: qty 100

## 2018-09-04 MED ORDER — KETAMINE HCL 10 MG/ML IJ SOLN
100.0000 mg | Freq: Once | INTRAMUSCULAR | Status: AC
  Administered 2018-09-04: 100 mg via INTRAVENOUS

## 2018-09-04 MED ORDER — EPINEPHRINE PF 1 MG/10ML IJ SOSY
PREFILLED_SYRINGE | INTRAMUSCULAR | Status: AC | PRN
  Administered 2018-09-04: 0.5 mg via INTRAVENOUS

## 2018-09-04 MED ORDER — VASOPRESSIN 20 UNIT/ML IV SOLN
0.0300 [IU]/min | INTRAVENOUS | Status: DC
  Administered 2018-09-04: 0.03 [IU]/min via INTRAVENOUS
  Filled 2018-09-04: qty 2

## 2018-09-04 MED ORDER — SODIUM CHLORIDE 0.9 % IV SOLN
2.0000 g | Freq: Once | INTRAVENOUS | Status: AC
  Administered 2018-09-04: 2 g via INTRAVENOUS
  Filled 2018-09-04: qty 2

## 2018-09-04 MED ORDER — FENTANYL 2500MCG IN NS 250ML (10MCG/ML) PREMIX INFUSION
100.0000 ug/h | INTRAVENOUS | Status: DC
  Administered 2018-09-04: 100 ug/h via INTRAVENOUS

## 2018-09-04 MED ORDER — NOREPINEPHRINE 4 MG/250ML-% IV SOLN
12.0000 ug/min | INTRAVENOUS | Status: DC
  Administered 2018-09-04: 12 ug/min via INTRAVENOUS
  Filled 2018-09-04: qty 250

## 2018-09-04 MED ORDER — HYDROCORTISONE NA SUCCINATE PF 100 MG IJ SOLR: 100.0000 mg | Freq: Once | INTRAMUSCULAR | Status: DC

## 2018-09-04 MED ORDER — SODIUM CHLORIDE 0.9 % IV BOLUS
2000.0000 mL | Freq: Once | INTRAVENOUS | Status: AC
  Administered 2018-09-04: 2000 mL via INTRAVENOUS

## 2018-09-04 MED ORDER — EPINEPHRINE PF 1 MG/10ML IJ SOSY
PREFILLED_SYRINGE | INTRAMUSCULAR | Status: AC | PRN
  Administered 2018-09-04: 0.1 mg via INTRAVENOUS

## 2018-09-04 MED ORDER — PROPOFOL 1000 MG/100ML IV EMUL
5.0000 ug/kg/min | INTRAVENOUS | Status: DC
  Administered 2018-09-04: 30 ug/kg/min via INTRAVENOUS

## 2018-09-04 MED ORDER — FENTANYL 2500MCG IN NS 250ML (10MCG/ML) PREMIX INFUSION
INTRAVENOUS | Status: AC
  Administered 2018-09-04: 100 ug/h via INTRAVENOUS
  Filled 2018-09-04: qty 250

## 2018-09-04 MED ORDER — VANCOMYCIN HCL IN DEXTROSE 1-5 GM/200ML-% IV SOLN
1000.0000 mg | Freq: Once | INTRAVENOUS | Status: AC
  Administered 2018-09-04: 1000 mg via INTRAVENOUS
  Filled 2018-09-04: qty 200

## 2018-09-06 LAB — BLOOD GAS, ARTERIAL
ACID-BASE DEFICIT: 3.2 mmol/L — AB (ref 0.0–2.0)
BICARBONATE: 16.6 mmol/L — AB (ref 20.0–28.0)
FIO2: 0.21
O2 Saturation: 83.9 %
PATIENT TEMPERATURE: 37
PH ART: 7.55 — AB (ref 7.350–7.450)
PO2 ART: 41 mmHg — AB (ref 83.0–108.0)
pCO2 arterial: 19 mmHg — CL (ref 32.0–48.0)

## 2018-09-06 MED FILL — Medication: Qty: 1 | Status: AC

## 2018-09-09 LAB — CULTURE, BLOOD (ROUTINE X 2)
CULTURE: NO GROWTH
Culture: NO GROWTH
Special Requests: ADEQUATE

## 2018-09-17 NOTE — ED Notes (Signed)
IV infusions stopped on chart but actually stopped by previous nurse when changed to comfort care

## 2018-09-17 NOTE — ED Provider Notes (Addendum)
[facility] Emergency Department Provider Note  ____________________________________________   First MD Initiated Contact with Patient 2018/09/28 0138     (approximate)  I have reviewed the triage vital signs and the nursing notes.   HISTORY  Chief Complaint Shortness of Breath  Level 5 exemption history is limited by the patient's critical condition  HPI Lucas Wiggins is a 84 y.o. male who comes to the emergency department by EMS with shortness of breath.  The patient was recently discharged from our hospital after having a bout of pneumonia and apparently also has a recent diagnosis of pulmonary fibrosis.  He says he is supposed to have home oxygen as of tomorrow however he became acutely more short of breath tonight and his daughter called 911.  He does report fevers and chills.  Is difficult for him to speak in more than 1 or 2 word gasps.    Past Medical History:  Diagnosis Date  . Chronic kidney disease   . Hypertension     Patient Active Problem List   Diagnosis Date Noted  . Hyponatremia 08/28/2018  . Sepsis ([place]) 08/28/2018  . 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 Hypertension 08/21/2014  . Renal insufficiency 08/21/2014  . Vitamin D deficiency 08/21/2014    Past Surgical History:  Procedure Laterality Date  . APPENDECTOMY    . TONSILLECTOMY AND ADENOIDECTOMY      Prior to Admission medications   Medication Sig Start Date End Date Taking? Authorizing Provider  albuterol (ACCUNEB) 1.25 MG/3ML nebulizer solution Take 1 ampule by nebulization every 6 (six) hours as needed for wheezing.   Yes [provider]  amoxicillin-clavulanate (AUGMENTIN) 875-125 MG tablet Take 1 tablet by mouth 2 (two) times daily. 09/01/18 09/11/18 Yes [provider]  aspirin 325 MG tablet Take 325 mg by mouth daily.   Yes [provider]  guaiFENesin-codeine 100-10 MG/5ML syrup Take 5 mLs by mouth every 6 (six) hours as needed for cough.  09/01/18  Yes [provider]  Iodine, Kelp, (KELP PO) Take 1 tablet by mouth daily.   Yes [provider]  multivitamin (ONE-A-DAY MEN'S) TABS tablet Take 1 tablet by mouth daily.   Yes [provider]    Allergies Patient has no known allergies.  Family History  Problem Relation Age of Onset  . Lung cancer Brother   . CAD Father     Social History Social History   Tobacco Use  . Smoking status: Former Research scientist (life sciences)  . Smokeless tobacco: Never Used  Substance Use Topics  . Alcohol use: Not Currently  . Drug use: Never    Review of Systems Level 5 exemption history limited by the patient's medical condition ____________________________________________   PHYSICAL EXAM:  VITAL SIGNS: ED Triage Vitals  Enc Vitals Group     BP      Pulse      Resp      Temp      Temp src      SpO2      Weight      Height      Head Circumference      Peak Flow      Pain Score      Pain Loc      Pain Edu?      Excl. in [place]?     Constitutional: Appears critically ill using accessory muscles breathing 50 times a minute and appears in severe distress Eyes: PERRL EOMI. Head: Atraumatic. Nose: No congestion/rhinnorhea. Mouth/Throat: No trismus  Neck: No stridor.  Positive for JVD Cardiovascular: Tachycardic rate, regular rhythm. Grossly normal heart sounds.  Good peripheral circulation. Respiratory: Severe respiratory distress.  Lung sounds are fibrotic although relatively equal bilaterally Gastrointestinal: Soft nontender Musculoskeletal: No lower extremity edema   Neurologic:   No gross focal neurologic deficits are appreciated. Skin: Diaphoretic Psychiatric: Anxious appearing  ____________________________________________   DIFFERENTIAL includes but not limited to  Pneumonia, influenza, sepsis, septic shock, pulmonary embolism, pneumothorax ____________________________________________   LABS (all labs ordered are listed, but only abnormal results are  displayed)  Labs Reviewed  URINALYSIS, COMPLETE (UACMP) WITH MICROSCOPIC - Abnormal; Notable for the following components:      Result Value   Color, Urine YELLOW (*)    APPearance CLEAR (*)    Glucose, UA 50 (*)    Hgb urine dipstick SMALL (*)    Protein, ur 30 (*)    Bacteria, UA RARE (*)    All other components within normal limits  COMPREHENSIVE METABOLIC PANEL - Abnormal; Notable for the following components:   Sodium 128 (*)    Chloride 94 (*)    Glucose, Bld 121 (*)    BUN 34 (*)    Creatinine, Ser 2.41 (*)    Albumin 3.1 (*)    AST 46 (*)    GFR calc non Af Amer 23 (*)    GFR calc Af Amer 26 (*)    All other components within normal limits  BRAIN NATRIURETIC PEPTIDE - Abnormal; Notable for the following components:   B Natriuretic Peptide 1,036.0 (*)    All other components within normal limits  TROPONIN I - Abnormal; Notable for the following components:   Troponin I 0.43 (*)    All other components within normal limits  CBC WITH DIFFERENTIAL/PLATELET - Abnormal; Notable for the following components:   WBC 15.7 (*)    RBC 4.16 (*)    HCT 38.7 (*)    Neutro Abs 12.1 (*)    Abs Immature Granulocytes 0.18 (*)    All other components within normal limits  BLOOD GAS, ARTERIAL - Abnormal; Notable for the following components:   pH, Arterial 7.55 (*)    pCO2 arterial 19 (*)    pO2, Arterial 41 (*)    Bicarbonate 16.6 (*)    Acid-base deficit 3.2 (*)    All other components within normal limits  LACTIC ACID, PLASMA - Abnormal; Notable for the following components:   Lactic Acid, Venous 2.9 (*)    All other components within normal limits  BLOOD GAS, ARTERIAL - Abnormal; Notable for the following components:   pH, Arterial 7.17 (*)    pO2, Arterial 123 (*)    Bicarbonate 14.6 (*)    Acid-base deficit 13.3 (*)    All other components within normal limits  CULTURE, BLOOD (ROUTINE X 2)  CULTURE, BLOOD (ROUTINE X 2)  CULTURE, RESPIRATORY  INFLUENZA PANEL BY PCR  (TYPE A & B)  LACTIC ACID, PLASMA    Lab work reviewed by me with a number of abnormalities most notably initial PaO2 of 41 on room air.  Elevated lactic acid and troponin concerning for severe sepsis. __________________________________________  EKG    ____________________________________________  [id number]  First chest x-ray reviewed by me consistent with known pulmonary fibrosis Second chest x-ray reviewed by me shows endotracheal and orogastric tubes in good position ____________________________________________   PROCEDURES  Procedure(s) performed: Yes  ARTERIAL BLOOD GAS Date/Time: 09/20/2018 1:45 AM Performed by: Darel Hong, MD Authorized by: Darel Hong, MD  Consent:    Consent obtained:  Verbal   Consent given by:  Patient   Risks discussed:  Bleeding, impaired circulation and pain   Alternatives discussed:  Alternative treatment Anesthesia (see MAR for exact dosages):    Anesthesia method:  None Procedure details:    Location:  R radial   Allen's test performed: yes     Number of attempts:  1 Post-procedure details:    Dressing applied: yes     Bleeding:  Manual pressure applied   Circulation, movement, and sensation:  Normal   Patient tolerance of procedure:  Tolerated well, no immediate complications .Critical Care Performed by: Darel Hong, MD Authorized by: Darel Hong, MD   Critical care provider statement:    Critical care time (minutes):  65   Critical care time was exclusive of:  Separately billable procedures and treating other patients   Critical care was necessary to treat or prevent imminent or life-threatening deterioration of the following conditions:  Shock, respiratory failure and sepsis   Critical care was time spent personally by me on the following activities:  Development of treatment plan with patient or surrogate, discussions with consultants, evaluation of patient's response to treatment, examination of patient,  obtaining history from patient or surrogate, ordering and performing treatments and interventions, ordering and review of laboratory studies, ordering and review of radiographic studies, pulse oximetry, re-evaluation of patient's condition and review of old charts Procedure Name: Intubation Date/Time: 08-Sep-2018 2:50 AM Performed by: Darel Hong, MD Pre-anesthesia Checklist: Patient identified, Patient being monitored, Emergency Drugs available, Timeout performed and Suction available Oxygen Delivery Method: Nasal cannula Preoxygenation: Pre-oxygenation with 100% oxygen Induction Type: Rapid sequence Ventilation: Mask ventilation without difficulty Laryngoscope Size: Mac and 4 Grade View: Grade I Tube size: 7.5 mm Placement Confirmation: ETT inserted through vocal cords under direct vision,  CO2 detector and Breath sounds checked- equal and bilateral Secured at: 23 cm Tube secured with: ETT holder    OG placement Date/Time: 09/08/2018 2:50 AM Performed by: Darel Hong, MD Authorized by: Darel Hong, MD  Consent: The procedure was performed in an emergent situation.  Sedation: Patient sedated: yes  Patient tolerance: Patient tolerated the procedure well with no immediate complications   Angiocath insertion Performed by: Darel Hong  Consent: Verbal consent obtained. Risks and benefits: risks, benefits and alternatives were discussed Time out: Immediately prior to procedure a "time out" was called to verify the correct patient, procedure, equipment, support staff and site/side marked as required.  Preparation: Patient was prepped and draped in the usual sterile fashion.  Vein Location: Right antecubital fossa  Ultrasound Guided  Gauge: 16  Normal blood return and flush without difficulty Patient tolerance: Patient tolerated the procedure well with no immediate complications.   Cardiopulmonary Resuscitation (CPR) Procedure Note Directed/Performed by: Darel Hong I personally directed ancillary staff and/or performed CPR in an effort to regain return of spontaneous circulation and to maintain cardiac, neuro and systemic perfusion.   Please see nursing notes for total CPR time.  The patient had 2 discrete cardiac arrest.  The first required 1 amp of epinephrine and 2 rounds of CPR and he regained pulses.  Shortly thereafter he lost pulses again and after 1 amp of epinephrine and 2 rounds of CPR he once again regained pulses.   Critical Care performed: Yes  ____________________________________________   INITIAL IMPRESSION / ASSESSMENT AND PLAN / ED COURSE  Pertinent labs & imaging results that were available during my care of the patient were reviewed by  me and considered in my medical decision making (see chart for details).   As part of my medical decision making, I reviewed the following data within the [place] History obtained from family if available, nursing notes, old chart and ekg, as well as notes from prior ED visits.  The patient comes to the emergency department critically ill-appearing with a respiratory rate in the 40s to 50s and hypoxic on room air.  He felt warm to the touch and I checked a rectal temperature that was 101.3 degrees concerning for sepsis with pulmonary etiology.  He has known pulmonary fibrosis which complicates the issue.  I performed an ABG on room air showing a PaO2 of 41.  I discussed endotracheal intubation with the patient and he asked me to "please put me to sleep."  He was intubated without complication using ketamine and succinylcholine although he did desaturate following intubation briefly to about 80%.  Aggressive IV antibiotics along with IV fluids were begun.  After the patient had his post intubation chest x-ray it was noted that he was bradycardic and I personally was unable to feel a pulse and began CPR.  Please see nursing notes for exact times however after 1 amp of epinephrine  into rounds of ACLS he regained pulses.  I began a norepinephrine infusion however shortly thereafter he lost pulses once again.  Family was brought into the room and witnessed cardiopulmonary resuscitation for 2 rounds after which he once again regained pulses.  I have put his norepinephrine to extremely high doses as the family says the patient would not want any further CPR.  We are continuing aggressive medical management with sedation, pain control, antibiotics, and vasopressors however the patient is currently DO NOT RESUSCITATE.  I discussed the case with the hospitalist Dr. Jannifer Franklin who has graciously agreed to admit the patient to his service.  Chaplain was brought to bedside and family is aware that the patient is gravely ill.    _________ ----------------------------------------- 5:47 AM on September 06, 2018 -----------------------------------------  I was called into the room as the patient became bradycardic and hypotensive as well as hypoxic.  With family present I felt for a pulse and could not feel one.  His heart rate was 29 on the monitor.  Bedside ultrasound showed no cardiac activity.  Time of death was called at 5:44 AM.  FINAL CLINICAL IMPRESSION(S) / ED DIAGNOSES  Final diagnoses:  Acute respiratory failure with hypoxia ([place])  Septic shock ([facility])      NEW MEDICATIONS STARTED DURING THIS VISIT:  New Prescriptions   No medications on file     Note:  This document was prepared using Dragon voice recognition software and may include unintentional dictation errors.     Darel Hong, MD 2018/09/06 3007    Darel Hong, MD 09-06-2018 [phone]

## 2018-09-17 NOTE — Progress Notes (Signed)
Update: patient coded x2. ACLS protocols followed. Patient now on 100% o2. Peep decreased to 5 per md. Will continue to monitor

## 2018-09-17 NOTE — ED Notes (Signed)
Post morton care performed.

## 2018-09-17 NOTE — Progress Notes (Signed)
   April 01, 2018 0300  Clinical Encounter Type  Visited With Patient and family together;Health care provider  Visit Type Initial;Patient actively dying  Referral From Nurse  Spiritual Encounters  Spiritual Needs Emotional;Grief support;Prayer   Chaplain responded to page to support family during patient's end of life.  Chaplain engaged patient's daughter and son-in-law at bedside in review of patient's life and relationships.  Daughter and son-in-law spoke of other losses and gratitude that patient had been healthy for as long as he had.  Chaplain maintained a calming presence, offering silent and energetic prayers throughout encounter.  Patient's son and daughter-in-law arrived and were updated by patient's daughter about patient's prognosis.  Chaplain excused herself to allow family time together with patient, encouraging them to page for chaplain as needed.

## 2018-09-17 NOTE — ED Triage Notes (Signed)
EMS pt to rm 4 from home with report of feeling short of breath. Pt discharged from hospital a few days ago after dx with lung infection, pulmonary fibrosis and afib.

## 2018-09-17 NOTE — ED Notes (Signed)
Fentanyl drip wasted by this RN witnessed by April Brumgard, RN

## 2018-09-17 NOTE — Progress Notes (Signed)
CODE SEPSIS - PHARMACY COMMUNICATION  **Broad Spectrum Antibiotics should be administered within 1 hour of Sepsis diagnosis**  Time Code Sepsis Called/Page Received: @ 0157  Antibiotics Ordered: cefepime 2g                                      Vancomycin 1g  Time of 1st antibiotic administration: @ 0225  Additional action taken by pharmacy: N/A  If necessary, Name of Provider/Nurse Contacted: N/A  Gardner CandleSheema M Josaiah Muhammed, PharmD, BCPS Clinical Pharmacist 2018-06-12 2:46 AM

## 2018-09-17 NOTE — ED Notes (Signed)
Witnessed fentanyl drip with ann cales, rn. Waste.

## 2018-09-17 NOTE — ED Notes (Signed)
Family remains at bedside. No needs voiced. Pt appears comfortable at this time.

## 2018-09-17 NOTE — ED Notes (Signed)
At the request of family pt to be comfort care. Dr Lamont Snowballifenbark requested monitor placed on comfort cart. Pt to remain intubated on sedation and pressors but no further CPR to be performed.

## 2018-09-17 NOTE — ED Notes (Signed)
Time of death [id number], pronounced by Dr. Lamont Snowballifenbark. Family remains at bedside.

## 2018-09-17 NOTE — ED Notes (Signed)
Daughter and son in law to bedside. Updated on current care pt is receiving.

## 2018-09-17 DEATH — deceased

## 2019-03-22 IMAGING — CT CT CHEST W/O CM
2 of 6 series · 14 of 36 positions shown, 18 images · non-contrast
Comparison: 08/28/2018 and 02/23/2005 chest x-ray. 12/21/2010 CT
abdomen and pelvis.

CLINICAL DATA: 88-year-old male with chest pain and shortness of
breath after taking generic form of Zyrtec. Subsequent encounter.

EXAM:
CT CHEST WITHOUT CONTRAST
TECHNIQUE: Multidetector CT imaging of the chest was performed following the
standard protocol without IV contrast.

[Series 2: thorax · axial · 0.74mm/px · z∈[-445,-181]mm · 13 of 148 slices shown, 17 images]
[im 8/148  mediastinal]
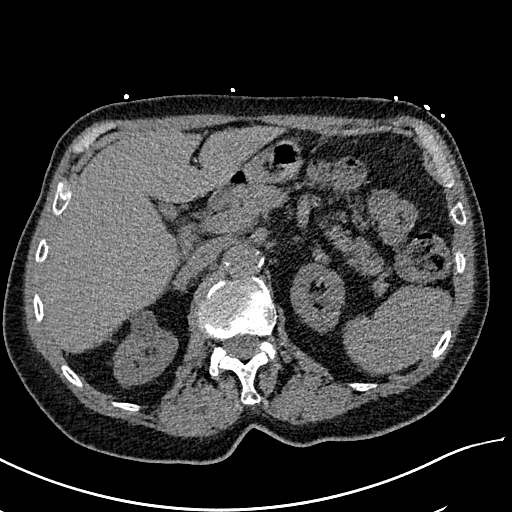
[im 8/148  lung]
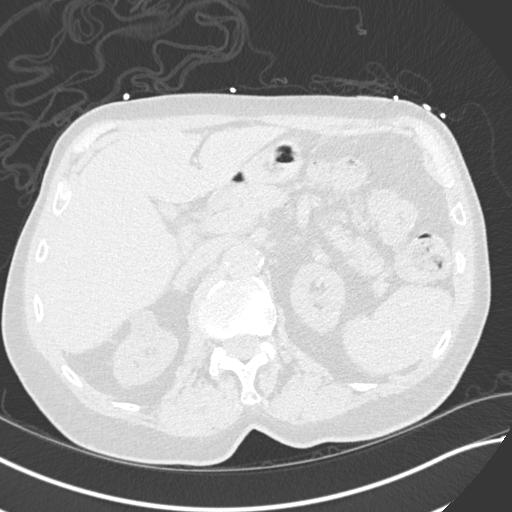
[im 23/148  lung]
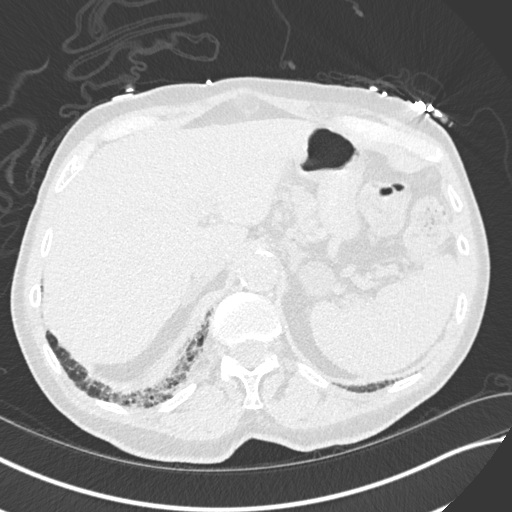
[im 30/148  lung]
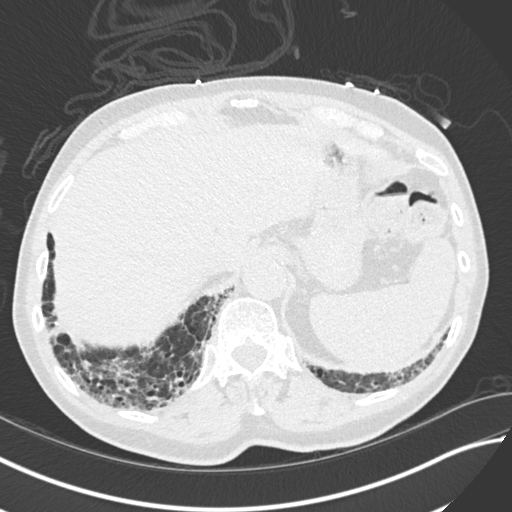
[im 45/148  lung]
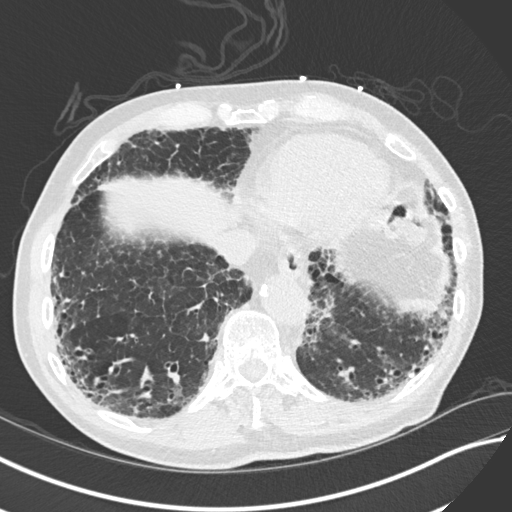
[im 52/148  mediastinal]
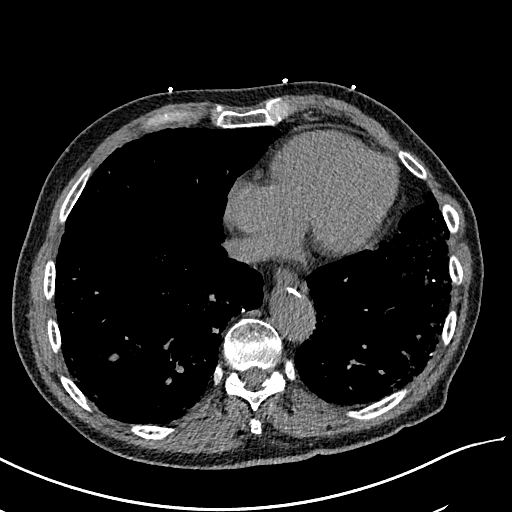
[im 52/148  lung]
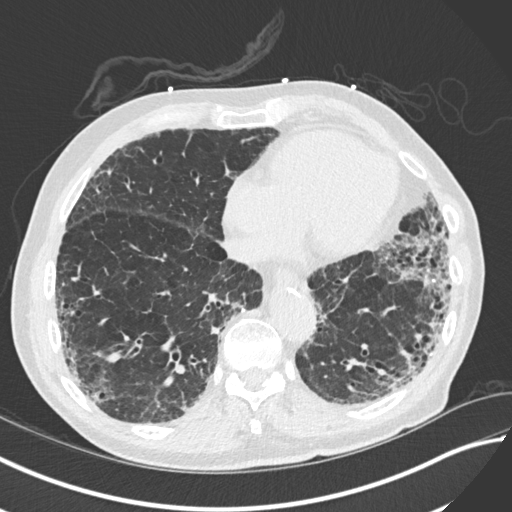
[im 67/148  lung]
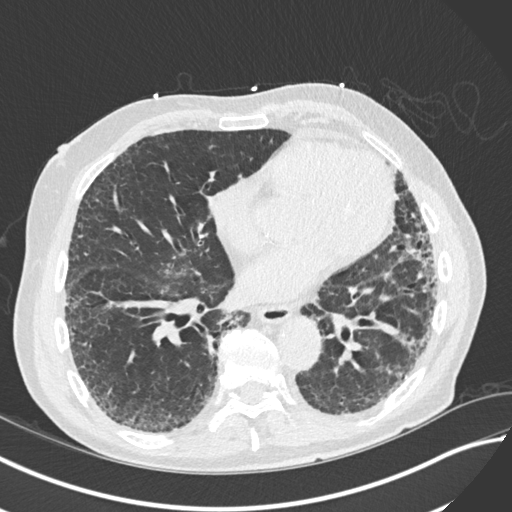
[im 74/148  lung]
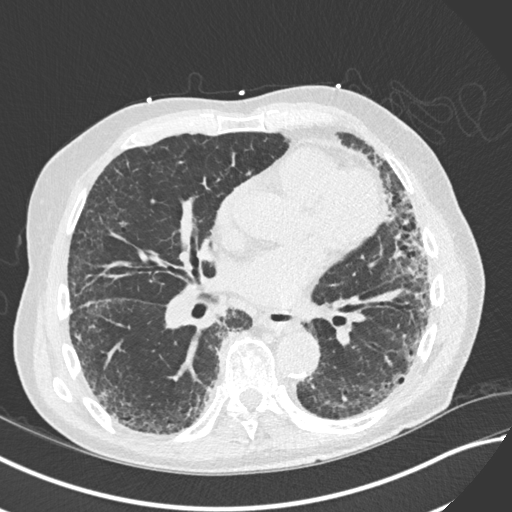
[im 81/148  lung]
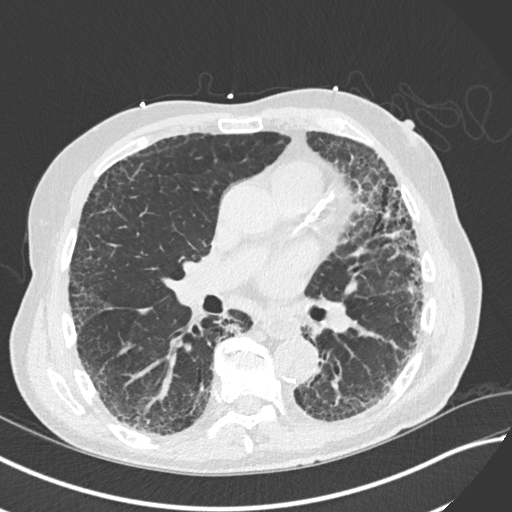
[im 96/148  mediastinal]
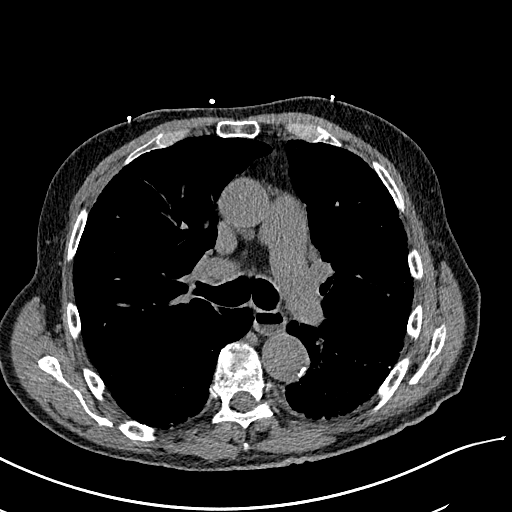
[im 96/148  lung]
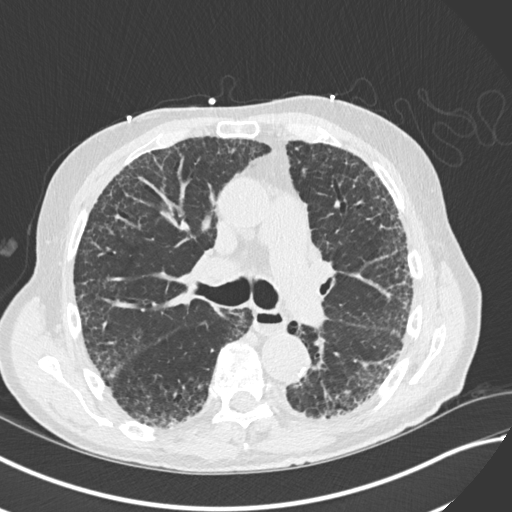
[im 103/148  lung]
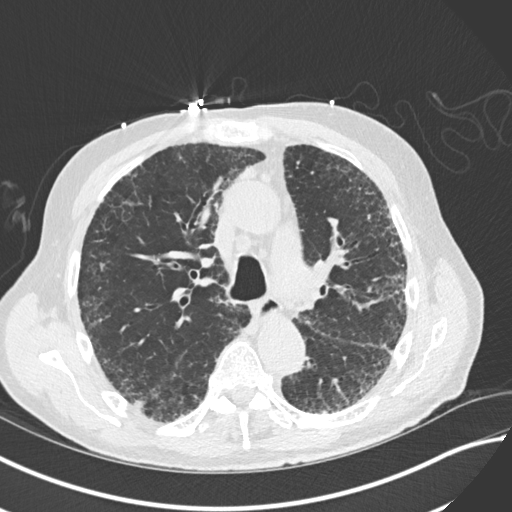
[im 118/148  lung]
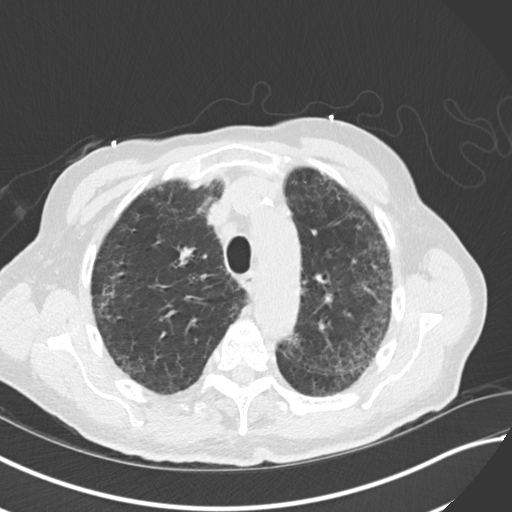
[im 125/148  lung]
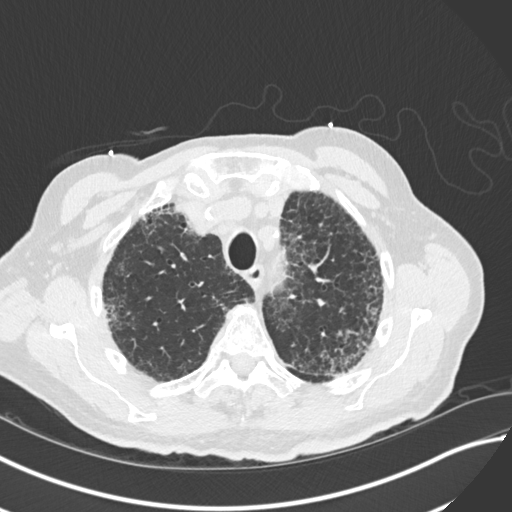
[im 140/148  mediastinal]
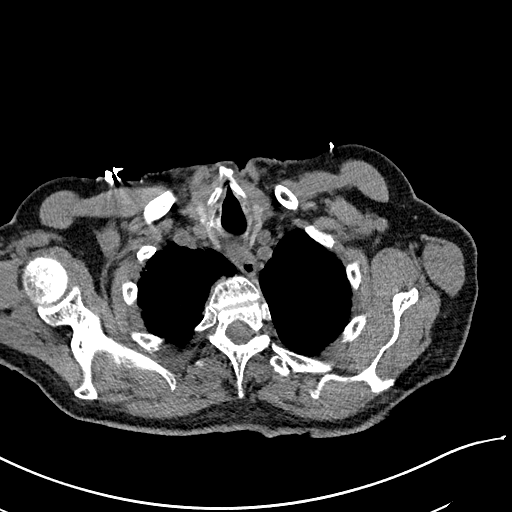
[im 140/148  lung]
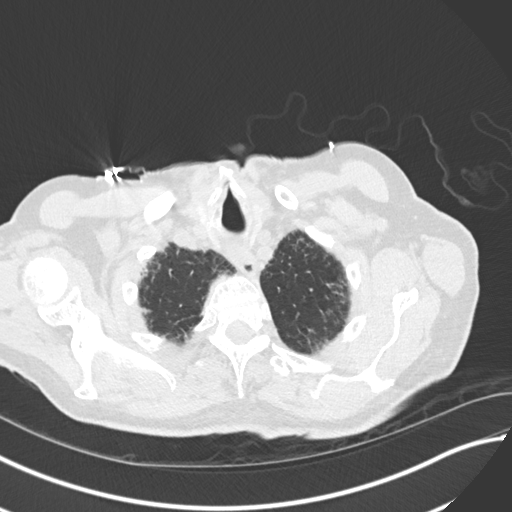

[Series 8: coronal · coronal · 0.59mm/px · 1 of 142 slices shown]
[im 71/142  lung]
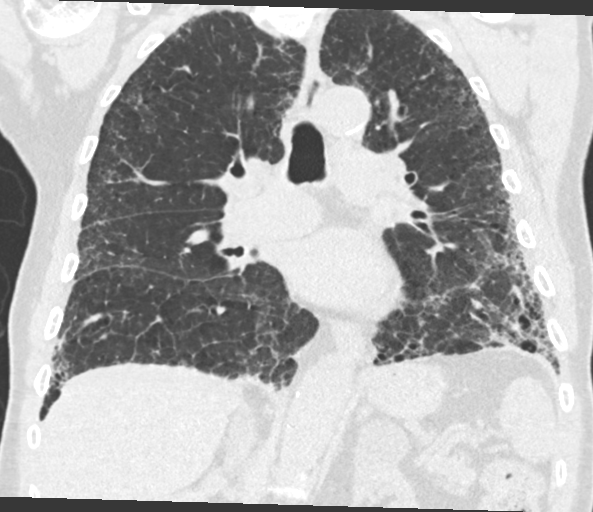

[14 of 36 positions shown; findings below may reference images not displayed]

FINDINGS: Cardiovascular: Heart size within normal limits. Trace pericardial
fluid. Marked coronary artery calcifications. Atherosclerotic
changes thoracic aorta. Ascending thoracic aorta measures 3.7 cm.

Mediastinum/Nodes: Increase number of normal slightly enlarged lymph
nodes. Largest lymph node subcarinal region with short axis
dimension of 1.9 cm.

Lungs/Pleura: Diffuse subpleural cystic changes greater in the
bases. Associated reticulation. Findings suggestive of UIP. 7 mm
nodule posteromedial right lower lobe (series 4, image 63 and series
10, image 75). Trachea and mainstem bronchi are patent.

Upper Abdomen: Renal cysts.

Musculoskeletal: Remote T7 and T8 anterior wedge compression
fracture with 70% loss of height anterior aspect T7 and 80% loss of
height anterior aspect T8. Vacuum discs T7-8 and T8-9. Mild kyphosis
centered at this level. Schmorl's node deformities superior endplate
T7, T8, T9 and inferior endplate T7 and T10.
IMPRESSION: 1. Diffuse subpleural cystic changes greater in the bases.
Associated reticulation. Findings suggestive of UIP.
2. 7 mm nodule posteromedial right lower lobe (series 4, image 63
and series 10, image 75). Non-contrast chest CT at 6-12 months is
recommended. If the nodule is stable at time of repeat CT, then
future CT at 18-24 months (from today's scan) is considered optional
for low-risk patients, but is recommended for high-risk patients.
This recommendation follows the consensus statement: Guidelines for
Management of Incidental Pulmonary Nodules Detected on CT Images:
3. Increase number of normal to slightly enlarged lymph nodes.
Largest lymph node subcarinal region with short axis dimension of
1.9 cm.
4. Prominent coronary artery calcifications.
5.  Aortic Atherosclerosis ([id number]).
6. Remote T7 and T8 anterior wedge compression fracture with 70%
loss of height anterior aspect T7 and 80% loss of height anterior
aspect T8. Mild kyphosis centered at this level.
